# Patient Record
Sex: Male | Born: 1985 | Race: White | Hispanic: No | Marital: Married | Smoking: Current every day smoker
Health system: Southern US, Community
[De-identification: ages and names within clinical notes are randomized; demographics above are authoritative.]

---
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
## 2004-01-01 ENCOUNTER — Emergency Department (HOSPITAL_COMMUNITY): Admission: EM | Admit: 2004-01-01 | Discharge: 2004-01-02 | Payer: Self-pay | Admitting: Emergency Medicine

## 2004-11-04 ENCOUNTER — Emergency Department (HOSPITAL_COMMUNITY): Admission: EM | Admit: 2004-11-04 | Discharge: 2004-11-04 | Payer: Self-pay | Admitting: Emergency Medicine

## 2008-08-22 ENCOUNTER — Encounter: Payer: Self-pay | Admitting: Emergency Medicine

## 2008-08-22 ENCOUNTER — Ambulatory Visit: Payer: Self-pay | Admitting: Diagnostic Radiology

## 2008-08-23 ENCOUNTER — Inpatient Hospital Stay (HOSPITAL_COMMUNITY): Admission: RE | Admit: 2008-08-23 | Discharge: 2008-08-25 | Payer: Self-pay | Admitting: Internal Medicine

## 2009-06-01 ENCOUNTER — Emergency Department (HOSPITAL_COMMUNITY): Admission: EM | Admit: 2009-06-01 | Discharge: 2009-06-01 | Payer: Self-pay | Admitting: Family Medicine

## 2010-07-08 LAB — COMPREHENSIVE METABOLIC PANEL
ALT: 12 U/L (ref 0–53)
ALT: 9 U/L (ref 0–53)
AST: 11 U/L (ref 0–37)
AST: 17 U/L (ref 0–37)
Albumin: 3.1 g/dL — ABNORMAL LOW (ref 3.5–5.2)
Albumin: 4.6 g/dL (ref 3.5–5.2)
Alkaline Phosphatase: 41 U/L (ref 39–117)
Alkaline Phosphatase: 65 U/L (ref 39–117)
BUN: 7 mg/dL (ref 6–23)
BUN: 12 mg/dL (ref 6–23)
CO2: 28 mEq/L (ref 19–32)
CO2: 21 mEq/L (ref 19–32)
Calcium: 8.8 mg/dL (ref 8.4–10.5)
Calcium: 9.8 mg/dL (ref 8.4–10.5)
Chloride: 107 mEq/L (ref 96–112)
Chloride: 101 mEq/L (ref 96–112)
Creatinine, Ser: 0.95 mg/dL (ref 0.4–1.5)
Creatinine, Ser: 0.9 mg/dL (ref 0.4–1.5)
GFR calc Af Amer: >60 mL/min (ref >60)
GFR calc Af Amer: >60 mL/min (ref >60)
GFR calc non Af Amer: >60 mL/min (ref >60)
GFR calc non Af Amer: >60 mL/min (ref >60)
Glucose, Bld: 91 mg/dL (ref 70–99)
Glucose, Bld: 104 mg/dL — ABNORMAL HIGH (ref 70–99)
Potassium: 4.1 mEq/L (ref 3.5–5.1)
Potassium: 3.7 mEq/L (ref 3.5–5.1)
Sodium: 140 mEq/L (ref 135–145)
Sodium: 139 mEq/L (ref 135–145)
Total Bilirubin: 1.2 mg/dL (ref 0.3–1.2)
Total Bilirubin: 1.4 mg/dL — ABNORMAL HIGH (ref 0.3–1.2)
Total Protein: 6.1 g/dL (ref 6.0–8.3)
Total Protein: 8.3 g/dL (ref 6.0–8.3)

## 2010-07-08 LAB — CBC
HCT: 36.9 % — ABNORMAL LOW (ref 39.0–52.0)
HCT: 37 % — ABNORMAL LOW (ref 39.0–52.0)
HCT: 38 % — ABNORMAL LOW (ref 39.0–52.0)
HCT: 45.9 % (ref 39.0–52.0)
Hemoglobin: 12.8 g/dL — ABNORMAL LOW (ref 13.0–17.0)
Hemoglobin: 12.9 g/dL — ABNORMAL LOW (ref 13.0–17.0)
Hemoglobin: 13.2 g/dL (ref 13.0–17.0)
Hemoglobin: 15.9 g/dL (ref 13.0–17.0)
MCHC: 34.6 g/dL (ref 30.0–36.0)
MCHC: 34.8 g/dL (ref 30.0–36.0)
MCHC: 34.9 g/dL (ref 30.0–36.0)
MCHC: 34.6 g/dL (ref 30.0–36.0)
MCV: 87.3 fL (ref 78.0–100.0)
MCV: 87.9 fL (ref 78.0–100.0)
MCV: 88.2 fL (ref 78.0–100.0)
MCV: 86.3 fL (ref 78.0–100.0)
Platelets: 142 10*3/uL — ABNORMAL LOW (ref 150–400)
Platelets: 150 10*3/uL (ref 150–400)
Platelets: 179 10*3/uL (ref 150–400)
Platelets: 183 10*3/uL (ref 150–400)
RBC: 4.2 MIL/uL — ABNORMAL LOW (ref 4.22–5.81)
RBC: 4.2 MIL/uL — ABNORMAL LOW (ref 4.22–5.81)
RBC: 4.36 MIL/uL (ref 4.22–5.81)
RBC: 5.32 MIL/uL (ref 4.22–5.81)
RDW: 13.4 % (ref 11.5–15.5)
RDW: 13.7 % (ref 11.5–15.5)
RDW: 13.8 % (ref 11.5–15.5)
RDW: 12.7 % (ref 11.5–15.5)
WBC: 16.3 10*3/uL — ABNORMAL HIGH (ref 4.0–10.5)
WBC: 7.5 10*3/uL (ref 4.0–10.5)
WBC: 7.6 10*3/uL (ref 4.0–10.5)
WBC: 21.1 10*3/uL — ABNORMAL HIGH (ref 4.0–10.5)

## 2010-07-08 LAB — DIFFERENTIAL
Basophils Absolute: 0 10*3/uL (ref 0.0–0.1)
Basophils Absolute: 0.4 10*3/uL — ABNORMAL HIGH (ref 0.0–0.1)
Basophils Relative: 0 % (ref 0–1)
Basophils Relative: 2 % — ABNORMAL HIGH (ref 0–1)
Eosinophils Absolute: 0 10*3/uL (ref 0.0–0.7)
Eosinophils Absolute: 0 10*3/uL (ref 0.0–0.7)
Eosinophils Relative: 0 % (ref 0–5)
Eosinophils Relative: 0 % (ref 0–5)
Lymphocytes Relative: 12 % (ref 12–46)
Lymphocytes Relative: 9 % — ABNORMAL LOW (ref 12–46)
Lymphs Abs: 1.9 10*3/uL (ref 0.7–4.0)
Lymphs Abs: 1.9 10*3/uL (ref 0.7–4.0)
Monocytes Absolute: 2 10*3/uL — ABNORMAL HIGH (ref 0.1–1.0)
Monocytes Absolute: 2.1 10*3/uL — ABNORMAL HIGH (ref 0.1–1.0)
Monocytes Relative: 12 % (ref 3–12)
Monocytes Relative: 10 % (ref 3–12)
Neutro Abs: 12.3 10*3/uL — ABNORMAL HIGH (ref 1.7–7.7)
Neutro Abs: 16.7 10*3/uL — ABNORMAL HIGH (ref 1.7–7.7)
Neutrophils Relative %: 76 % (ref 43–77)
Neutrophils Relative %: 79 % — ABNORMAL HIGH (ref 43–77)

## 2010-07-08 LAB — STOOL CULTURE

## 2010-07-08 LAB — BASIC METABOLIC PANEL
BUN: 8 mg/dL (ref 6–23)
BUN: 8 mg/dL (ref 6–23)
CO2: 25 mEq/L (ref 19–32)
CO2: 28 mEq/L (ref 19–32)
Calcium: 8.5 mg/dL (ref 8.4–10.5)
Calcium: 9 mg/dL (ref 8.4–10.5)
Chloride: 103 mEq/L (ref 96–112)
Chloride: 107 mEq/L (ref 96–112)
Creatinine, Ser: 0.86 mg/dL (ref 0.4–1.5)
Creatinine, Ser: 0.89 mg/dL (ref 0.4–1.5)
GFR calc Af Amer: >60 mL/min (ref >60)
GFR calc Af Amer: >60 mL/min (ref >60)
GFR calc non Af Amer: >60 mL/min (ref >60)
GFR calc non Af Amer: >60 mL/min (ref >60)
Glucose, Bld: 83 mg/dL (ref 70–99)
Glucose, Bld: 99 mg/dL (ref 70–99)
Potassium: 3.7 mEq/L (ref 3.5–5.1)
Potassium: 3.7 mEq/L (ref 3.5–5.1)
Sodium: 137 mEq/L (ref 135–145)
Sodium: 141 mEq/L (ref 135–145)

## 2010-07-08 LAB — URINALYSIS, ROUTINE W REFLEX MICROSCOPIC
Bilirubin Urine: NEGATIVE
Glucose, UA: NEGATIVE mg/dL
Hgb urine dipstick: NEGATIVE
Ketones, ur: >80 mg/dL — AB
Nitrite: NEGATIVE
Protein, ur: NEGATIVE mg/dL
Specific Gravity, Urine: 1.008 (ref 1.005–1.030)
Urobilinogen, UA: 1 mg/dL (ref 0.0–1.0)
pH: 6.5 (ref 5.0–8.0)

## 2010-07-08 LAB — CLOSTRIDIUM DIFFICILE EIA

## 2010-08-12 NOTE — H&P (Signed)
NAME:  Bryan Gonzales, Bryan Gonzales NO.:  [id number]   MEDICAL RECORD NO.:  [id number]          PATIENT TYPE:  INP   LOCATION:  6743                         FACILITY:  [facility]   PHYSICIAN:  Michiel Cowboy, MDDATE OF BIRTH:  December 06, 1985   DATE OF ADMISSION:  08/23/2008  DATE OF DISCHARGE:                              HISTORY & PHYSICAL   PRIMARY CARE Armanie Martine:  Dr. Arletha Grippe with [facility].   CHIEF COMPLAINT:  Abdominal pain and then 2 days of diarrhea on Monday  and Tuesday.   The patient to 25 year old gentleman with no significant past medical  history who starting on Monday had two episodes of diarrhea and  abdominal pain, then he became constipated, tried to caster oil with no  results.  He has continued to have abdominal pain and gas at which point  he eventually presented to the walk-in clinic, who sent him over to be  evaluated in the emergency department for possible appendicitis.  CT  scan at the [facility] was obtained, showed  diverticulitis versus inflammatory bowel disease with a small amount of  free air, but no abscess.  The patient at this point was transferred to  [facility] under the care of [facility] hospitalist with a consult from  [facility], who felt that at this point the patient would be best  served by IV hydration, IV antibiotics, hold off on operation as the  patient does not appear to have an acute abdomen.   PAST MEDICAL HISTORY:  Unremarkable.   SOCIAL HISTORY:  The patient used to smoke, quit about one week ago.  Does not drink except occasionally.  Does not abuse drugs.   FAMILY HISTORY:  No significant inflammatory bowel disease in the  family, otherwise unremarkable.   ALLERGIES:  PENICILLIN.   MEDICATIONS:  None.   PHYSICAL EXAMINATION:  VITAL SIGNS:  Temperature 98.8, blood pressure  115/69, pulse 70, respirations 18.  Saturation 98% on room air.  GENERAL:  The patient appears to be in no acute distress  currently.  HEENT:  Head nontraumatic.  Moist mucous membranes.  LUNGS:  Clear to auscultation bilaterally.  HEART:  Regular rate and rhythm.  No murmurs, rubs or gallops.  ABDOMEN:  Soft.  There is mild generalized tenderness, but no rebound or  guarding noted.  LOWER EXTREMITIES:  Without clubbing, clubbing or edema.  NEUROLOGIC:  The patient appears to grossly intact.  SKIN:  Clean, dry and intact.   LABORATORY DATA:  White blood cell count 21.1, hemoglobin 15.9, sodium  139, potassium 3.7, creatinine 0.9.  UA negative.  CT scan revealed  rectosigmoid colon inflammation diverticulosis versus inflammatory bowel  disease.  There is a small amount of free air   ASSESSMENT/PLAN:  1. This is a 25 year old gentleman with the possibility of colitis      versus inflammatory bowel disease admitted for IV antibiotics      management.  2. Questionable diverticulitis.  Appreciate surgical consult.  We will      admit for Cipro and Flagyl IV and IV fluids.  Check for  orthostatics.  Keep n.p.o.  Give IV pain medications and follow.  3. Prophylaxis.  Protonix and sequential compression devices.      Michiel Cowboy, MD  Electronically Signed     AVD/MEDQ  D:  08/23/2008  T:  08/23/2008  Job:  [id number]   cc:   Nelma Rothman, MD

## 2010-08-12 NOTE — Consult Note (Signed)
NAME:  Bryan Gonzales, Bryan Gonzales NO.:  [id number]   MEDICAL RECORD NO.:  [id number]          PATIENT TYPE:  INP   LOCATION:  6743                         FACILITY:  [facility]   PHYSICIAN:  Michiel Cowboy, MDDATE OF BIRTH:  1985-07-02   DATE OF CONSULTATION:  08/23/2008  DATE OF DISCHARGE:                                 CONSULTATION   REASON FOR EVALUATION:  Diverticulitis.   HISTORY OF PRESENT ILLNESS:  This is a healthy 25 year old male with no  significant past medical history who presents with a 4-day history of  lower abdominal pain.  This pain has not localized to one side or the  other.  The pain is associated with a decreased appetite but no fever.  He reports nonbloody diarrhea over the last couple of days.  The patient  reports no previous similar episodes.  He has not had any problems with  his bowel movements in the past.  He denies any blood in the stool or  any mucus in his stool.  He denies any chronic abdominal pain or  abdominal cramping.  He was evaluated at [facility] earlier  where he was noted to be febrile and had a white count up to 21,000.  He  was then evaluated at the [facility] Department  where a CT scan showed mild thickening of the terminal ileum but a  normal-appearing appendix.  CT of the pelvis showed marked thickening of  the sigmoid colon down to the rectum with small amounts of free air seen  on couple of the images.  There is a small amount of fluid in the pelvis  but no sign of abscess.  The patient was transported to [facility] for  admission and we are asked to consult for management of his colon micro  perforation.   PAST MEDICAL HISTORY:  None.   PAST SURGICAL HISTORY:  Hernia and wisdom tooth extraction.   ALLERGIES:  PENICILLIN which causes itching, although it appears that  the patient did receive Unasyn in the emergency department.   MEDICATIONS:  None.   SOCIAL HISTORY:  The patient is a  smoker but is currently trying to  quit.  He does not drink alcohol.   LABORATORY DATA:  White count 21.1 and hemoglobin 15.9.  Electrolytes  within normal limits.  Total bilirubin is mildly elevated at 1.4, alk  phos 65, AST 17, and ALT 9.   PHYSICAL EXAMINATION:  VITAL SIGNS:  The patient is afebrile with  temperature 98.8, heart rate 70, blood pressure 113/69, and sats 98% on  room air.  GENERAL:  This is a well-developed, well-nourished male in no apparent  distress.  HEENT:  EOMI.  Sclerae anicteric.Marland Kitchen  NECK:  No mass and no thyromegaly.  LUNGS:  Clear.  Normal respiratory effort.  HEART:  Regular rate and rhythm.  No murmur.  ABDOMEN:  Hypoactive bowel sounds, nondistended, soft, tender in the  suprapubic region, and in both lower quadrants.  No palpable masses, no  rebound, some mild guarding.  EXTREMITIES:  No edema.  SKIN:  Warm and  dry with no sign of jaundice.   IMPRESSION:  Although this is unusual in this age group, this appears to  be sigmoid diverticulitis with micro perforation but no abscess.  Also  the differential diagnosis is little bit less likely given his history  of inflammatory bowel disease such as Crohn's versus ulcerative colitis.   RECOMMENDATIONS:  We will keep the patient n.p.o. and bowel rest.  IV  hydration and IV antibiotics.  Currently, he is ordered to start  ciprofloxacin and Flagyl and these antibiotics are suitable.  We will  continue to follow.  If his condition worsens, then I explained to the  patient that he might need an urgent operation which might result in a  temporary colostomy.  However, he does not appear to be septic and I  think we can try to manage this nonoperatively at this admission.  It is  likely that in the future he will need further workup with colonoscopy  and possible elective colon resection.  We will continue to follow with  you.      Wilmon Arms. Tsuei, M.D.  Electronically Signed      Michiel Cowboy,  MD  Electronically Signed    MKT/MEDQ  D:  08/23/2008  T:  08/23/2008  Job:  [id number]

## 2012-08-15 ENCOUNTER — Emergency Department: Payer: Self-pay | Admitting: Emergency Medicine

## 2012-08-15 LAB — COMPREHENSIVE METABOLIC PANEL
Albumin: 4.1 g/dL (ref 3.4–5.0)
Alkaline Phosphatase: 55 U/L (ref 50–136)
Anion Gap: 9 (ref 7–16)
BUN: 12 mg/dL (ref 7–18)
Bilirubin,Total: 0.6 mg/dL (ref 0.2–1.0)
Calcium, Total: 9.4 mg/dL (ref 8.5–10.1)
Chloride: 105 mmol/L (ref 98–107)
Co2: 23 mmol/L (ref 21–32)
Creatinine: 0.84 mg/dL (ref 0.60–1.30)
EGFR (African American): >60
EGFR (Non-African Amer.): >60
Glucose: 93 mg/dL (ref 65–99)
Osmolality: 273 (ref 275–301)
Potassium: 3.9 mmol/L (ref 3.5–5.1)
SGOT(AST): 26 U/L (ref 15–37)
SGPT (ALT): 25 U/L (ref 12–78)
Sodium: 137 mmol/L (ref 136–145)
Total Protein: 7.3 g/dL (ref 6.4–8.2)

## 2012-08-15 LAB — CBC
HCT: 40.1 % (ref 40.0–52.0)
HGB: 13.9 g/dL (ref 13.0–18.0)
MCH: 29.3 pg (ref 26.0–34.0)
MCHC: 34.6 g/dL (ref 32.0–36.0)
MCV: 85 fL (ref 80–100)
Platelet: 208 10*3/uL (ref 150–440)
RBC: 4.74 10*6/uL (ref 4.40–5.90)
RDW: 13.7 % (ref 11.5–14.5)
WBC: 14.6 10*3/uL — ABNORMAL HIGH (ref 3.8–10.6)

## 2012-08-16 LAB — URINALYSIS, COMPLETE
Bacteria: NONE SEEN
Bilirubin,UR: NEGATIVE
Blood: NEGATIVE
Glucose,UR: NEGATIVE mg/dL (ref 0–75)
Leukocyte Esterase: NEGATIVE
Nitrite: NEGATIVE
Ph: 6 (ref 4.5–8.0)
Protein: NEGATIVE
RBC,UR: 1 /HPF (ref 0–5)
Specific Gravity: 1.026 (ref 1.003–1.030)
Squamous Epithelial: 1
WBC UR: 1 /HPF (ref 0–5)

## 2012-08-21 LAB — CULTURE, BLOOD (SINGLE)

## 2013-08-15 ENCOUNTER — Emergency Department (HOSPITAL_BASED_OUTPATIENT_CLINIC_OR_DEPARTMENT_OTHER)
Admission: EM | Admit: 2013-08-15 | Discharge: 2013-08-15 | Disposition: A | Discharge status: Home / Self Care | Payer: Self-pay | Attending: Emergency Medicine | Admitting: Emergency Medicine

## 2013-08-15 ENCOUNTER — Encounter (HOSPITAL_BASED_OUTPATIENT_CLINIC_OR_DEPARTMENT_OTHER): Payer: Self-pay | Admitting: Emergency Medicine

## 2013-08-15 DIAGNOSIS — J3489 Other specified disorders of nose and nasal sinuses: Secondary | ICD-10-CM | POA: Insufficient documentation

## 2013-08-15 DIAGNOSIS — R52 Pain, unspecified: Secondary | ICD-10-CM | POA: Insufficient documentation

## 2013-08-15 DIAGNOSIS — F17200 Nicotine dependence, unspecified, uncomplicated: Secondary | ICD-10-CM | POA: Insufficient documentation

## 2013-08-15 DIAGNOSIS — J029 Acute pharyngitis, unspecified: Secondary | ICD-10-CM | POA: Insufficient documentation

## 2013-08-15 DIAGNOSIS — Z880 Allergy status to penicillin: Secondary | ICD-10-CM | POA: Insufficient documentation

## 2013-08-15 LAB — RAPID STREP SCREEN (MED CTR MEBANE ONLY): Streptococcus, Group A Screen (Direct): NEGATIVE

## 2013-08-15 MED ORDER — IBUPROFEN 800 MG PO TABS: 800.0000 mg | ORAL_TABLET | Freq: Three times a day (TID) | ORAL | Status: AC

## 2013-08-15 MED ORDER — HYDROCODONE-ACETAMINOPHEN 5-325 MG PO TABS: 2.0000 | ORAL_TABLET | ORAL | Status: AC | PRN

## 2013-08-15 MED ORDER — IBUPROFEN 800 MG PO TABS
800.0000 mg | ORAL_TABLET | Freq: Once | ORAL | Status: AC
  Administered 2013-08-15: 800 mg via ORAL
  Filled 2013-08-15: qty 1

## 2013-08-15 MED ORDER — HYDROCODONE-ACETAMINOPHEN 5-325 MG PO TABS
2.0000 | ORAL_TABLET | Freq: Once | ORAL | Status: AC
  Administered 2013-08-15: 2 via ORAL
  Filled 2013-08-15: qty 2

## 2013-08-15 NOTE — ED Notes (Signed)
Pt reports around noon today started having body aches, sore throat, and nasal congestion with headache. s

## 2013-08-15 NOTE — Discharge Instructions (Signed)
Viral Infections °A virus is a type of germ. Viruses can cause: °· Minor sore throats. °· Aches and pains. °· Headaches. °· Runny nose. °· Rashes. °· Watery eyes. °· Tiredness. °· Coughs. °· Loss of appetite. °· Feeling sick to your stomach (nausea). °· Throwing up (vomiting). °· Watery poop (diarrhea). °HOME CARE  °· Only take medicines as told by your doctor. °· Drink enough water and fluids to keep your pee (urine) clear or pale yellow. Sports drinks are a good choice. °· Get plenty of rest and eat healthy. Soups and broths with crackers or rice are fine. °GET HELP RIGHT AWAY IF:  °· You have a very bad headache. °· You have shortness of breath. °· You have chest pain or neck pain. °· You have an unusual rash. °· You cannot stop throwing up. °· You have watery poop that does not stop. °· You cannot keep fluids down. °· You or your child has a temperature by mouth above 102° F (38.9° C), not controlled by medicine. °· Your baby is older than 3 months with a rectal temperature of 102° F (38.9° C) or higher. °· Your baby is 3 months old or younger with a rectal temperature of 100.4° F (38° C) or higher. °MAKE SURE YOU:  °· Understand these instructions. °· Will watch this condition. °· Will get help right away if you are not doing well or get worse. °Document Released: 02/27/2008 Document Revised: 06/08/2011 Document Reviewed: 07/22/2010 °ExitCare® Patient Information ©2014 [employer]. ° °

## 2013-08-15 NOTE — ED Provider Notes (Signed)
CSN: [id number]     Arrival date & time 08/15/13  1821 History   First MD Initiated Contact with Patient 08/15/13 1948     Chief Complaint  Patient presents with  . Generalized Body Aches  . Sore Throat  . Nasal Congestion     (Consider location/radiation/quality/duration/timing/severity/associated sxs/prior Treatment) Patient is a 28 y.o. male presenting with pharyngitis. The history is provided by the patient. No language interpreter was used.  Sore Throat This is a new problem. The current episode started today. The problem occurs constantly. The problem has been gradually worsening. Associated symptoms include a sore throat. Pertinent negatives include no myalgias. Nothing aggravates the symptoms. He has tried nothing for the symptoms. The treatment provided no relief.    History reviewed. No pertinent past medical history. History reviewed. No pertinent past surgical history. No family history on file. History  Substance Use Topics  . Smoking status: Current Every Day Smoker -- 0.50 packs/day    Types: Cigarettes  . Smokeless tobacco: Not on file  . Alcohol Use: Yes     Comment: socially    Review of Systems  HENT: Positive for sore throat.   Musculoskeletal: Negative for myalgias.  All other systems reviewed and are negative.     Allergies  Penicillins  Home Medications   Prior to Admission medications   Not on File   BP 113/68  Pulse 88  Temp(Src) 98.8 F (37.1 C) (Oral)  Resp 18  Ht 5\' 9"  (1.753 m)  Wt 155 lb (70.308 kg)  BMI 22.88 kg/m2  SpO2 100% Physical Exam  Nursing note and vitals reviewed. Constitutional: He is oriented to person, place, and time. He appears well-developed and well-nourished.  HENT:  Head: Normocephalic.  Right Ear: External ear normal.  Left Ear: External ear normal.  Nose: Nose normal.  Mouth/Throat: Oropharynx is clear and moist.  Eyes: Conjunctivae are normal. Pupils are equal, round, and reactive to light.  Neck:  Normal range of motion. Neck supple.  Cardiovascular: Normal rate and normal heart sounds.   Pulmonary/Chest: Effort normal and breath sounds normal.  Musculoskeletal: Normal range of motion.  Neurological: He is alert and oriented to person, place, and time. He has normal reflexes.  Skin: Skin is warm.  Psychiatric: He has a normal mood and affect.    ED Course  Procedures (including critical care time) Labs Review Labs Reviewed  RAPID STREP SCREEN  CULTURE, GROUP A STREP    Imaging Review No results found.   EKG Interpretation None      MDM   Final diagnoses:  Pharyngitis  strep negative,    Hydrocodone, ibuprofen    Elson AreasLeslie K Chelby Salata, PA-C 08/15/13 2148  Lonia SkinnerLeslie K ViennaSofia, [place] JerseyPA-C 08/15/13 2157

## 2013-08-16 NOTE — ED Provider Notes (Signed)
Medical screening examination/treatment/procedure(s) were performed by non-physician practitioner and as supervising physician I was immediately available for consultation/collaboration.  Megan E Docherty, MD 08/16/13 2226 

## 2013-08-17 LAB — CULTURE, GROUP A STREP

## 2016-01-25 ENCOUNTER — Encounter (HOSPITAL_BASED_OUTPATIENT_CLINIC_OR_DEPARTMENT_OTHER): Payer: Self-pay | Admitting: *Deleted

## 2016-01-25 ENCOUNTER — Emergency Department (HOSPITAL_BASED_OUTPATIENT_CLINIC_OR_DEPARTMENT_OTHER): Payer: Self-pay

## 2016-01-25 ENCOUNTER — Emergency Department (HOSPITAL_BASED_OUTPATIENT_CLINIC_OR_DEPARTMENT_OTHER)
Admission: EM | Admit: 2016-01-25 | Discharge: 2016-01-25 | Disposition: A | Discharge status: Home / Self Care | Payer: Self-pay | Attending: Emergency Medicine | Admitting: Emergency Medicine

## 2016-01-25 DIAGNOSIS — K858 Other acute pancreatitis without necrosis or infection: Secondary | ICD-10-CM | POA: Insufficient documentation

## 2016-01-25 DIAGNOSIS — F17210 Nicotine dependence, cigarettes, uncomplicated: Secondary | ICD-10-CM | POA: Insufficient documentation

## 2016-01-25 DIAGNOSIS — R0789 Other chest pain: Secondary | ICD-10-CM | POA: Insufficient documentation

## 2016-01-25 LAB — CBC WITH DIFFERENTIAL/PLATELET
Basophils Absolute: 0 10*3/uL (ref 0.0–0.1)
Basophils Relative: 0 %
Eosinophils Absolute: 0 10*3/uL (ref 0.0–0.7)
Eosinophils Relative: 0 %
HCT: 48 % (ref 39.0–52.0)
HEMOGLOBIN: 17.1 g/dL — AB (ref 13.0–17.0)
LYMPHS ABS: 0.9 10*3/uL (ref 0.7–4.0)
Lymphocytes Relative: 4 %
MCH: 29.5 pg (ref 26.0–34.0)
MCHC: 35.6 g/dL (ref 30.0–36.0)
MCV: 82.9 fL (ref 78.0–100.0)
Monocytes Absolute: 0.8 10*3/uL (ref 0.1–1.0)
Monocytes Relative: 4 %
NEUTROS ABS: 20.3 10*3/uL — AB (ref 1.7–7.7)
NEUTROS PCT: 92 %
Platelets: 218 10*3/uL (ref 150–400)
RBC: 5.79 MIL/uL (ref 4.22–5.81)
RDW: 13.8 % (ref 11.5–15.5)
WBC: 22.1 10*3/uL — ABNORMAL HIGH (ref 4.0–10.5)

## 2016-01-25 LAB — LIPASE, BLOOD: LIPASE: 302 U/L — AB (ref 11–51)

## 2016-01-25 LAB — COMPREHENSIVE METABOLIC PANEL
ALK PHOS: 42 U/L (ref 38–126)
ALT: 19 U/L (ref 17–63)
AST: 20 U/L (ref 15–41)
Albumin: 4.6 g/dL (ref 3.5–5.0)
Anion gap: 11 (ref 5–15)
BILIRUBIN TOTAL: 1.1 mg/dL (ref 0.3–1.2)
BUN: 17 mg/dL (ref 6–20)
CALCIUM: 9.6 mg/dL (ref 8.9–10.3)
CO2: 20 mmol/L — ABNORMAL LOW (ref 22–32)
CREATININE: 0.89 mg/dL (ref 0.61–1.24)
Chloride: 107 mmol/L (ref 101–111)
Glucose, Bld: 156 mg/dL — ABNORMAL HIGH (ref 65–99)
Potassium: 4 mmol/L (ref 3.5–5.1)
Sodium: 138 mmol/L (ref 135–145)
Total Protein: 7.6 g/dL (ref 6.5–8.1)

## 2016-01-25 LAB — TROPONIN I

## 2016-01-25 MED ORDER — OXYCODONE-ACETAMINOPHEN 5-325 MG PO TABS: 1.0000 | ORAL_TABLET | ORAL | 0 refills | Status: DC | PRN

## 2016-01-25 MED ORDER — ONDANSETRON 4 MG PO TBDP: 4.0000 mg | ORAL_TABLET | Freq: Three times a day (TID) | ORAL | 0 refills | Status: AC | PRN

## 2016-01-25 MED ORDER — KETOROLAC TROMETHAMINE 30 MG/ML IJ SOLN
30.0000 mg | Freq: Once | INTRAMUSCULAR | Status: AC
  Administered 2016-01-25: 30 mg via INTRAVENOUS
  Filled 2016-01-25: qty 1

## 2016-01-25 MED ORDER — SODIUM CHLORIDE 0.9 % IV BOLUS (SEPSIS)
1000.0000 mL | Freq: Once | INTRAVENOUS | Status: AC
  Administered 2016-01-25: 1000 mL via INTRAVENOUS

## 2016-01-25 MED ORDER — HYDROMORPHONE HCL 1 MG/ML IJ SOLN
1.0000 mg | Freq: Once | INTRAMUSCULAR | Status: AC
  Administered 2016-01-25: 1 mg via INTRAVENOUS
  Filled 2016-01-25: qty 1

## 2016-01-25 MED ORDER — ONDANSETRON HCL 4 MG/2ML IJ SOLN
4.0000 mg | Freq: Once | INTRAMUSCULAR | Status: AC
  Administered 2016-01-25: 4 mg via INTRAVENOUS
  Filled 2016-01-25: qty 2

## 2016-01-25 MED ORDER — IOPAMIDOL (ISOVUE-300) INJECTION 61%
100.0000 mL | Freq: Once | INTRAVENOUS | Status: AC | PRN
  Administered 2016-01-25: 100 mL via INTRAVENOUS

## 2016-01-25 NOTE — ED Provider Notes (Signed)
[facility]   CSN: [id number] Arrival date & time: 01/25/16  1016     History   Chief Complaint Chief Complaint  Patient presents with  . Abdominal Pain    HPI Bryan Gonzales is a 30 y.o. male.  The history is provided by the patient and medical records.  Abdominal Pain   Associated symptoms include diarrhea, nausea and vomiting.     30 year old male with no significant past medical history presenting to the ED for abdominal pain and chest pain.  Patient states he was at a concert one week ago and did some stage diving and crowd surfing. He states he had some pain in his left chest wall intermittently afterwards. States he has not had any shortness of breath, dizziness, or weakness. He has no known cardiac history. He is a smoker. States this morning he woke up around 4 AM with abdominal cramping, nausea, vomiting, and diarrhea. States he throughout and had diarrhea for about an hour and a half. States afterwards he had worsening pain in his left chest.  States he does not feel like it is his heart, but states he cannot get comfortable. He does report recent sick contacts with GI bug. He denies any fever or chills. He has not tried any medications thus far this morning.  History reviewed. No pertinent past medical history.  There are no active problems to display for this patient.   History reviewed. No pertinent surgical history.     Home Medications    Prior to Admission medications   Medication Sig Start Date End Date Taking? Authorizing Provider  HYDROcodone-acetaminophen (NORCO/VICODIN) 5-325 MG per tablet Take 2 tablets by mouth every 4 (four) hours as needed. 08/15/13   Elson AreasLeslie K Sofia, PA-C  ibuprofen (ADVIL,MOTRIN) 800 MG tablet Take 1 tablet (800 mg total) by mouth 3 (three) times daily. 08/15/13   Elson AreasLeslie K Sofia, PA-C    Family History No family history on file.  Social History Social History  Substance Use Topics  . Smoking  status: Current Every Day Smoker    Packs/day: 0.50    Types: Cigarettes  . Smokeless tobacco: Not on file  . Alcohol use Yes     Comment: socially     Allergies   Penicillins   Review of Systems Review of Systems  Cardiovascular: Positive for chest pain.  Gastrointestinal: Positive for abdominal pain, diarrhea, nausea and vomiting.  All other systems reviewed and are negative.    Physical Exam Updated Vital Signs BP (!) 147/122   Pulse 69   Temp 98.8 F (37.1 C) (Oral)   Resp 18   Ht 5\' 9"  (1.753 m)   Wt 72.6 kg   SpO2 100%   BMI 23.63 kg/m   Physical Exam  Constitutional: He is oriented to person, place, and time. He appears well-developed and well-nourished.  Pacing around the room, will not sit down  HENT:  Head: Normocephalic and atraumatic.  Mouth/Throat: Oropharynx is clear and moist.  Mucous membranes appear mildly dry  Eyes: Conjunctivae and EOM are normal. Pupils are equal, round, and reactive to light.  Neck: Normal range of motion.  Cardiovascular: Normal rate, regular rhythm and normal heart sounds.   Pulmonary/Chest: Effort normal and breath sounds normal.  Left chest wall is tender to palpation surrounding the areola and extending into the left axilla, there is no bruising or bony deformity, lungs clear, no distress  Abdominal: Soft. Bowel sounds are normal. There is tenderness in the  epigastric area. There is no rigidity and no guarding.  Abdomen soft, mild TTP in epigastrium  Musculoskeletal: Normal range of motion.  Neurological: He is alert and oriented to person, place, and time.  Skin: Skin is warm and dry.  Psychiatric: He has a normal mood and affect.  Nursing note and vitals reviewed.    ED Treatments / Results  Labs (all labs ordered are listed, but only abnormal results are displayed) Labs Reviewed  CBC WITH DIFFERENTIAL/PLATELET - Abnormal; Notable for the following:       Result Value   WBC 22.1 (*)    Hemoglobin 17.1 (*)     Neutro Abs 20.3 (*)    All other components within normal limits  COMPREHENSIVE METABOLIC PANEL - Abnormal; Notable for the following:    CO2 20 (*)    Glucose, Bld 156 (*)    All other components within normal limits  LIPASE, BLOOD - Abnormal; Notable for the following:    Lipase 302 (*)    All other components within normal limits  TROPONIN I    EKG  EKG Interpretation  Date/Time:  Saturday January 25 2016 10:28:49 EDT Ventricular Rate:  75 PR Interval:    QRS Duration: 96 QT Interval:  377 QTC Calculation: 421 R Axis:   -112 Text Interpretation:  Sinus rhythm Left anterior fascicular block Low voltage, extremity leads Baseline wander in lead(s) I II III aVR aVL aVF V2 V3 V4 V5 V6 No old tracing to compare Confirmed by FLOYD MD, DANIEL [phone]([id number]) on 01/25/2016 10:32:34 AM       Radiology Dg Chest 2 View  Result Date: 01/25/2016 CLINICAL DATA:  Left chest wall pain, vomiting EXAM: CHEST  2 VIEW COMPARISON:  None. FINDINGS: Lungs are clear.  No pleural effusion or pneumothorax. The heart is normal in size. Visualized osseous structures are within normal limits. IMPRESSION: Normal chest radiographs. Electronically Signed   By: Charline BillsSriyesh  Krishnan M.D.   On: 01/25/2016 12:03   Ct Abdomen Pelvis W Contrast  Result Date: 01/25/2016 CLINICAL DATA:  Left upper quadrant pain with nausea vomiting. EXAM: CT ABDOMEN AND PELVIS WITH CONTRAST TECHNIQUE: Multidetector CT imaging of the abdomen and pelvis was performed using the standard protocol following bolus administration of intravenous contrast. CONTRAST:  100mL ISOVUE-300 IOPAMIDOL (ISOVUE-300) INJECTION 61% COMPARISON:  08/22/2008 FINDINGS: Lower chest: No acute abnormality. Hepatobiliary: No focal liver abnormality is seen. No gallstones, gallbladder wall thickening, or biliary dilatation. Pancreas: Mild hypoenhancement of the mid pancreatic body with expansion and peripancreatic inflammatory changes most consistent with acute pancreatitis.  Peripancreatic fluid is present. Spleen: Normal in size without focal abnormality. Adrenals/Urinary Tract: Adrenal glands are unremarkable. Kidneys are normal, without renal calculi, focal lesion, or hydronephrosis. Bladder is unremarkable. Stomach/Bowel: Stomach is within normal limits. Appendix appears normal. No evidence of bowel wall thickening, distention, or inflammatory changes. Diverticulosis without evidence of diverticulitis. Vascular/Lymphatic: No significant vascular findings are present. No enlarged abdominal or pelvic lymph nodes. Reproductive: Prostate is unremarkable. Other: No abdominal wall hernia or abnormality. Musculoskeletal: No acute or significant osseous findings. IMPRESSION: 1. Findings consistent with acute pancreatitis. No focal fluid collection to suggest a pseudocyst or abscess. Electronically Signed   By: Elige KoHetal  Patel   On: 01/25/2016 14:01    Procedures Procedures (including critical care time)  Medications Ordered in ED Medications  sodium chloride 0.9 % bolus 1,000 mL (0 mLs Intravenous Stopped 01/25/16 1245)  ondansetron (ZOFRAN) injection 4 mg (4 mg Intravenous Given 01/25/16 1123)  ketorolac (TORADOL) 30  MG/ML injection 30 mg (30 mg Intravenous Given 01/25/16 1124)  HYDROmorphone (DILAUDID) injection 1 mg (1 mg Intravenous Given 01/25/16 1233)  iopamidol (ISOVUE-300) 61 % injection 100 mL (100 mLs Intravenous Contrast Given 01/25/16 1335)     Initial Impression / Assessment and Plan / ED Course  I have reviewed the triage vital signs and the nursing notes.  Pertinent labs & imaging results that were available during my care of the patient were reviewed by me and considered in my medical decision making (see chart for details).  Clinical Course   30 year old male here with abdominal and left-sided chest pain. Chest pain worsened this morning after heavy bouts of vomiting. He is afebrile and nontoxic here. Left chest wall is tender to palpation without noted  deformity or bruising. Some tenderness in the epigastrium of the abdomen. No rebound or guarding. Labs obtained, leukocytosis noted a 20 2K. Lipase elevated at 302 consistent with pancreatitis. Patient denies any alcohol use in the past 24 hours. States he is an occasional drinker. No history of gallstones. Given his significant leukocytosis, CT scan was obtained, consistent with pancreatic inflammation, no signs of abscess or pseudocyst. Patient has remained stable here without any episodes of emesis or diarrhea. His pain is controlled. He is tolerating fluids well.  His chest pain seems musculoskeletal in nature as it is reproducible on exam.  EKG is nonischemic. Troponin is negative. Has no known cardiac risk factors aside from smoking. Lower suspicion for ACS, PE, dissection, or other acute cardiac event.  At this time, feel patient stable for discharge with symptomatic care. Discussed dietary modifications including limiting alcohol use. He was given GI follow-up if symptoms persist.  Discussed plan with patient, he acknowledged understanding and agreed with plan of care.  Return precautions given for new or worsening symptoms.  Final Clinical Impressions(s) / ED Diagnoses   Final diagnoses:  Other acute pancreatitis, unspecified complication status    New Prescriptions New Prescriptions   ONDANSETRON (ZOFRAN ODT) 4 MG DISINTEGRATING TABLET    Take 1 tablet (4 mg total) by mouth every 8 (eight) hours as needed for nausea.   OXYCODONE-ACETAMINOPHEN (PERCOCET/ROXICET) 5-325 MG TABLET    Take 1 tablet by mouth every 4 (four) hours as needed.     Garlon Hatchet, PA-C 01/25/16 1509    Melene Plan, DO 01/25/16 [phone]

## 2016-01-25 NOTE — ED Triage Notes (Addendum)
Patient states he woke at 0400 with abdominal pain which was associated with nausea, vomiting x 6 and diarrhea x 3.  States he injured his left chest one week ago at a concert where he was "stage diving" and dancing rough.  States after the vomiting episode this morning, the left chest began to hurt constantly. Patient is pacing in the room and cussing about his pain.

## 2016-01-25 NOTE — Discharge Instructions (Signed)
Take the prescribed medication as directed.  Do not drive while taking pain medicine. Recommend clear diet for the next 24 hours to allow bowel to rest.  May start back with bland diet and progress back to normal as tolerated. Avoid alcohol as this is a known trigger of pancreatitis. Follow-up with GI if symptoms not improving or become a recurrent issue. Return to the ED for new or worsening symptoms.

## 2016-02-01 ENCOUNTER — Emergency Department (HOSPITAL_BASED_OUTPATIENT_CLINIC_OR_DEPARTMENT_OTHER)
Admission: EM | Admit: 2016-02-01 | Discharge: 2016-02-01 | Disposition: A | Discharge status: Home / Self Care | Payer: Self-pay | Attending: Emergency Medicine | Admitting: Emergency Medicine

## 2016-02-01 ENCOUNTER — Emergency Department (HOSPITAL_BASED_OUTPATIENT_CLINIC_OR_DEPARTMENT_OTHER): Payer: Self-pay

## 2016-02-01 ENCOUNTER — Encounter (HOSPITAL_BASED_OUTPATIENT_CLINIC_OR_DEPARTMENT_OTHER): Payer: Self-pay | Admitting: Emergency Medicine

## 2016-02-01 DIAGNOSIS — F17210 Nicotine dependence, cigarettes, uncomplicated: Secondary | ICD-10-CM | POA: Insufficient documentation

## 2016-02-01 DIAGNOSIS — R0789 Other chest pain: Secondary | ICD-10-CM | POA: Insufficient documentation

## 2016-02-01 LAB — COMPREHENSIVE METABOLIC PANEL
ALBUMIN: 4.2 g/dL (ref 3.5–5.0)
ALT: 20 U/L (ref 17–63)
ANION GAP: 8 (ref 5–15)
AST: 19 U/L (ref 15–41)
Alkaline Phosphatase: 47 U/L (ref 38–126)
BUN: 9 mg/dL (ref 6–20)
CALCIUM: 9.6 mg/dL (ref 8.9–10.3)
CO2: 29 mmol/L (ref 22–32)
Chloride: 101 mmol/L (ref 101–111)
Creatinine, Ser: 0.82 mg/dL (ref 0.61–1.24)
GFR calc non Af Amer: >60 mL/min (ref >60)
GLUCOSE: 85 mg/dL (ref 65–99)
POTASSIUM: 4.1 mmol/L (ref 3.5–5.1)
SODIUM: 138 mmol/L (ref 135–145)
Total Bilirubin: 0.2 mg/dL — ABNORMAL LOW (ref 0.3–1.2)
Total Protein: 7.8 g/dL (ref 6.5–8.1)

## 2016-02-01 LAB — CBC WITH DIFFERENTIAL/PLATELET
BASOS ABS: 0 10*3/uL (ref 0.0–0.1)
Basophils Relative: 0 %
EOS ABS: 0.1 10*3/uL (ref 0.0–0.7)
Eosinophils Relative: 1 %
HCT: 44.1 % (ref 39.0–52.0)
Hemoglobin: 15.1 g/dL (ref 13.0–17.0)
LYMPHS ABS: 3.4 10*3/uL (ref 0.7–4.0)
Lymphocytes Relative: 35 %
MCH: 29.7 pg (ref 26.0–34.0)
MCHC: 34.2 g/dL (ref 30.0–36.0)
MCV: 86.8 fL (ref 78.0–100.0)
MONO ABS: 0.3 10*3/uL (ref 0.1–1.0)
MONOS PCT: 3 %
NEUTROS ABS: 5.9 10*3/uL (ref 1.7–7.7)
Neutrophils Relative %: 61 %
PLATELETS: 289 10*3/uL (ref 150–400)
RBC: 5.08 MIL/uL (ref 4.22–5.81)
RDW: 13.4 % (ref 11.5–15.5)
WBC: 9.7 10*3/uL (ref 4.0–10.5)

## 2016-02-01 LAB — LIPASE, BLOOD: Lipase: 53 U/L — ABNORMAL HIGH (ref 11–51)

## 2016-02-01 LAB — TROPONIN I

## 2016-02-01 MED ORDER — OXYCODONE-ACETAMINOPHEN 5-325 MG PO TABS
1.0000 | ORAL_TABLET | Freq: Once | ORAL | Status: AC
  Administered 2016-02-01: 1 via ORAL
  Filled 2016-02-01: qty 1

## 2016-02-01 MED ORDER — KETOROLAC TROMETHAMINE 30 MG/ML IJ SOLN
15.0000 mg | Freq: Once | INTRAMUSCULAR | Status: AC
  Administered 2016-02-01: 15 mg via INTRAVENOUS
  Filled 2016-02-01: qty 1

## 2016-02-01 MED ORDER — NAPROXEN 500 MG PO TABS: 500.0000 mg | ORAL_TABLET | Freq: Two times a day (BID) | ORAL | 0 refills | Status: AC

## 2016-02-01 MED ORDER — OXYCODONE-ACETAMINOPHEN 5-325 MG PO TABS: 1.0000 | ORAL_TABLET | Freq: Four times a day (QID) | ORAL | 0 refills | Status: AC | PRN

## 2016-02-01 NOTE — ED Triage Notes (Addendum)
Pt in c/o L lateral chest/rib cage pain onset over 1 week ago, worse with movement and deep inspiration. Was seen here on the 27th with negative workup. Pt alert, interactive, ambulatory in NAD.

## 2016-02-01 NOTE — ED Notes (Signed)
Patient transported to X-ray 

## 2016-02-01 NOTE — Discharge Instructions (Signed)
Take your medications as prescribed as needed for pain relief. I recommend following up with her primary care provider within the next week if your pain has not improved. Please return to the Emergency Department if symptoms worsen or new onset of fever, headache, lightheadedness/dizziness, nasal/worsening chest pain, difficulty breathing, productive cough, coughing up blood, abdominal pain, vomiting, leg swelling.

## 2016-02-01 NOTE — ED Notes (Signed)
States has had chest pain, worse with movement x 2 weeks , after body surfing at a concert.

## 2016-02-01 NOTE — ED Provider Notes (Signed)
[facility] Provider Note   CSN: [id number] Arrival date & time: 02/01/16  1414  By signing my name below, I, Modena Jansky, attest that this documentation has been prepared under the direction and in the presence of non-physician practitioner, Melburn Hake, PA-C. Electronically Signed: Modena Jansky, Scribe. 02/01/2016. 4:55 PM.  History   Chief Complaint Chief Complaint  Patient presents with  . Chest Pain   The history is provided by the patient and medical records. No language interpreter was used.   HPI Comments: Bryan Gonzales is a 30 y.o. male who presents to the Emergency Department complaining of constant moderate left-sided chest pain that started about a 2 weeks ago. Per medical record, he was seen in the ED for pancreatitis on 01/25/16, which pt states has now been resolved. He describes the pain as a sharp sensation that radiates to his back. He reports the pain is exacerbated by coughing, breathing deeply, and certain movements. He states he took Percocet and Ibuprofen with temporary relief. He admits to having a falling episode 2 weeks ago while he was crowd surfing at a concert. He denies any prior hx of similar complaint, recent long trips, recent surgeries, hx of cancer, hx of DVT, vomiting, cough, wheezing, SOB, heart palpations, nausea, vomiting, urinary symptoms, diarrhea, rash, leg swelling, numbness, or other complaints.   History reviewed. No pertinent past medical history.  There are no active problems to display for this patient.   History reviewed. No pertinent surgical history.     Home Medications    Prior to Admission medications   Medication Sig Start Date End Date Taking? Authorizing Provider  HYDROcodone-acetaminophen (NORCO/VICODIN) 5-325 MG per tablet Take 2 tablets by mouth every 4 (four) hours as needed. 08/15/13   Elson Areas, PA-C  ibuprofen (ADVIL,MOTRIN) 800 MG tablet Take 1 tablet (800 mg total) by mouth 3 (three) times  daily. 08/15/13   Elson Areas, PA-C  naproxen (NAPROSYN) 500 MG tablet Take 1 tablet (500 mg total) by mouth 2 (two) times daily. 02/01/16   Barrett Henle, PA-C  ondansetron (ZOFRAN ODT) 4 MG disintegrating tablet Take 1 tablet (4 mg total) by mouth every 8 (eight) hours as needed for nausea. 01/25/16   Garlon Hatchet, PA-C  oxyCODONE-acetaminophen (PERCOCET/ROXICET) 5-325 MG tablet Take 1 tablet by mouth every 6 (six) hours as needed. 02/01/16   Barrett Henle, PA-C    Family History History reviewed. No pertinent family history.  Social History Social History  Substance Use Topics  . Smoking status: Current Every Day Smoker    Packs/day: 0.50    Types: Cigarettes  . Smokeless tobacco: Not on file  . Alcohol use Yes     Comment: socially     Allergies   Penicillins   Review of Systems Review of Systems  Respiratory: Negative for cough, shortness of breath and wheezing.   Cardiovascular: Positive for chest pain. Negative for palpitations and leg swelling.  Gastrointestinal: Negative for diarrhea, nausea and vomiting.  Genitourinary: Negative for discharge and dysuria.  All other systems reviewed and are negative.    Physical Exam Updated Vital Signs BP 107/67 (BP Location: Right Arm)   Pulse (!) 54   Temp 98.7 F (37.1 C)   Resp 20   Ht 5\' 9"  (1.753 m)   Wt 160 lb (72.6 kg)   SpO2 100%   BMI 23.63 kg/m   Physical Exam  Constitutional: He is oriented to person, place, and time. He appears well-developed  and well-nourished.  HENT:  Head: Normocephalic and atraumatic.  Mouth/Throat: Uvula is midline, oropharynx is clear and moist and mucous membranes are normal. No oropharyngeal exudate, posterior oropharyngeal edema, posterior oropharyngeal erythema or tonsillar abscesses. No tonsillar exudate.  Eyes: Conjunctivae and EOM are normal. Right eye exhibits no discharge. Left eye exhibits no discharge. No scleral icterus.  Neck: Normal range of  motion. Neck supple.  Cardiovascular: Normal rate, regular rhythm, normal heart sounds and intact distal pulses.   Pulmonary/Chest: Effort normal and breath sounds normal. No respiratory distress. He has no wheezes. He has no rales. He exhibits tenderness (TTP over sternum and left anterior chest wall.). He exhibits no laceration, no crepitus, no edema, no deformity, no swelling and no retraction.  Abdominal: Soft. Bowel sounds are normal. He exhibits no distension and no mass. There is no tenderness. There is no rebound and no guarding. No hernia.  Musculoskeletal: Normal range of motion. He exhibits no edema.  Neurological: He is alert and oriented to person, place, and time.  Skin: Skin is warm and dry.  Nursing note and vitals reviewed.    ED Treatments / Results  DIAGNOSTIC STUDIES: Oxygen Saturation is 100% on RA, normal by my interpretation.    COORDINATION OF CARE: 4:59 PM- Pt advised of plan for treatment and pt agrees.  Labs (all labs ordered are listed, but only abnormal results are displayed) Labs Reviewed  COMPREHENSIVE METABOLIC PANEL - Abnormal; Notable for the following:       Result Value   Total Bilirubin 0.2 (*)    All other components within normal limits  LIPASE, BLOOD - Abnormal; Notable for the following:    Lipase 53 (*)    All other components within normal limits  CBC WITH DIFFERENTIAL/PLATELET  TROPONIN I    EKG  EKG Interpretation None       Radiology Dg Ribs Unilateral W/chest Left  Result Date: 02/01/2016 CLINICAL DATA:  30 year old male with a history of left anterior rib pain for 2 weeks EXAM: LEFT RIBS AND CHEST - 3+ VIEW COMPARISON:  01/25/2016 FINDINGS: No fracture or other bone lesions are seen involving the ribs. There is no evidence of pneumothorax or pleural effusion. Both lungs are clear. Heart size and mediastinal contours are within normal limits. IMPRESSION: Negative for acute cardiopulmonary disease. No displaced rib fracture.  Signed, Yvone NeuJaime S. Loreta AveWagner, DO [facility] Electronically Signed   By: Gilmer MorJaime  Wagner D.O.   On: 02/01/2016 18:09    Procedures Procedures (including critical care time)  Medications Ordered in ED Medications  ketorolac (TORADOL) 30 MG/ML injection 15 mg (15 mg Intravenous Given 02/01/16 1500)  oxyCODONE-acetaminophen (PERCOCET/ROXICET) 5-325 MG per tablet 1 tablet (1 tablet Oral Given 02/01/16 1858)     Initial Impression / Assessment and Plan / ED Course  I have reviewed the triage vital signs and the nursing notes.  Pertinent labs & imaging results that were available during my care of the patient were reviewed by me and considered in my medical decision making (see chart for details).  Clinical Course    Patient presents with chest pain has been present for the past 2 weeks. Reports being seen in the ED on 10/28 for similar symptoms with associated abdominal pain and states he was diagnosed with pancreatitis. Patient reports taking his medications as prescribed with improvement of abdominal pain and nausea/vomiting. Reports having continued chest pain. Reports falling wall crowd surfing a few weeks ago at a concert. Denies fever.  VSS. Exam revealed tenderness over left anterior chest wall, remaining exam unremarkable. Patient given pain meds in the ED. EKG shows sinus rhythm with no acute ischemic changes. Labs unremarkable. Lab work shows improvement of lipase and LFTs compared to his prior ED visit for pancreatitis. Troponin negative. Chest x-ray negative. Reevaluation patient reports his chest pain has improved. Suspect patient's chest pain is likely due to musculoskeletal chest wall pain. I have a low suspicion for ACS, PE, dissection, or other acute cardiac event at this time. Plan to discharge patient home with symptomatic treatment and PCP follow-up. Discussed return precautions.  Final Clinical Impressions(s) / ED Diagnoses    Final diagnoses:  Chest wall pain    New Prescriptions Discharge Medication List as of 02/01/2016  7:39 PM    START taking these medications   Details  naproxen (NAPROSYN) 500 MG tablet Take 1 tablet (500 mg total) by mouth 2 (two) times daily., Starting Sat 02/01/2016, Print       I personally performed the services described in this documentation, which was scribed in my presence. The recorded information has been reviewed and is accurate.     Satira Sarkicole Elizabeth MontgomeryNadeau, [place] JerseyPA-C 02/02/16 [id number]    Alvira MondayErin Schlossman, MD 02/02/16 1320

## 2017-03-03 IMAGING — CT CT ABD-PELV W/ CM
2 of 4 series · 16 of 46 positions shown, 18 images · IV contrast (APPLIED)
Comparison: 08/22/2008

CLINICAL DATA: Left upper quadrant pain with nausea vomiting.

EXAM:
CT ABDOMEN AND PELVIS WITH CONTRAST
TECHNIQUE: Multidetector CT imaging of the abdomen and pelvis was performed
using the standard protocol following bolus administration of
intravenous contrast.
CONTRAST:  100mL B4IN23-RFF IOPAMIDOL (B4IN23-RFF) INJECTION 61%

[Series 2: axial st · axial · 0.75mm/px · z∈[-462,-32]mm · 13 of 94 slices shown, 15 images]
[im 4/94  soft-tissue]
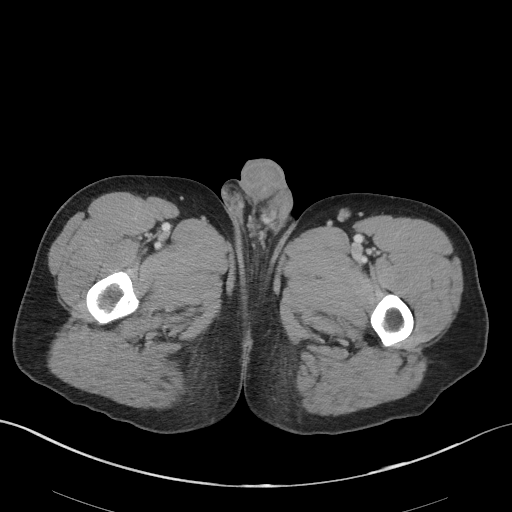
[im 4/94  bone]
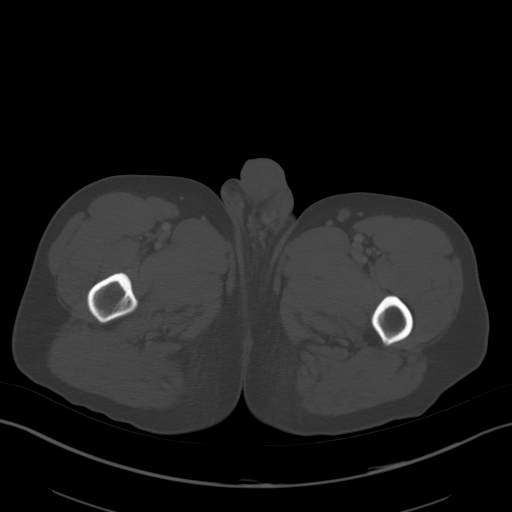
[im 12/94  soft-tissue]
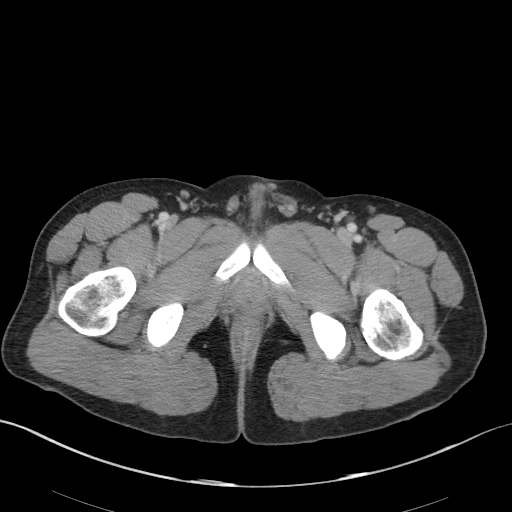
[im 20/94  soft-tissue]
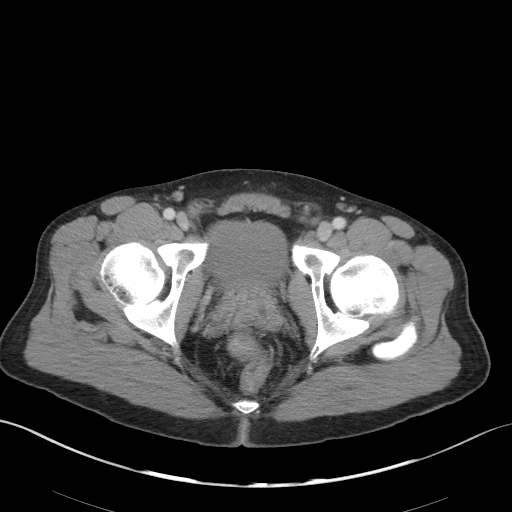
[im 28/94  soft-tissue]
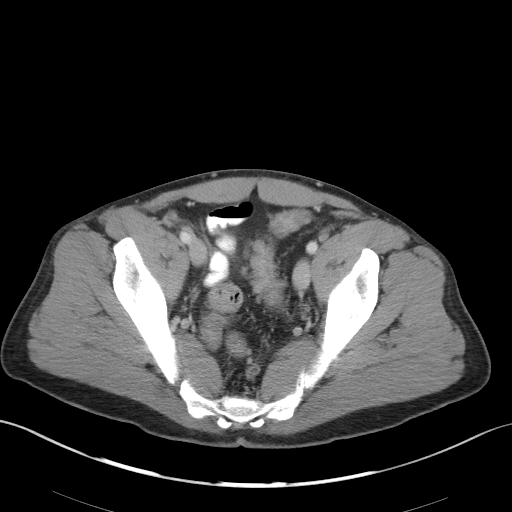
[im 32/94  soft-tissue]
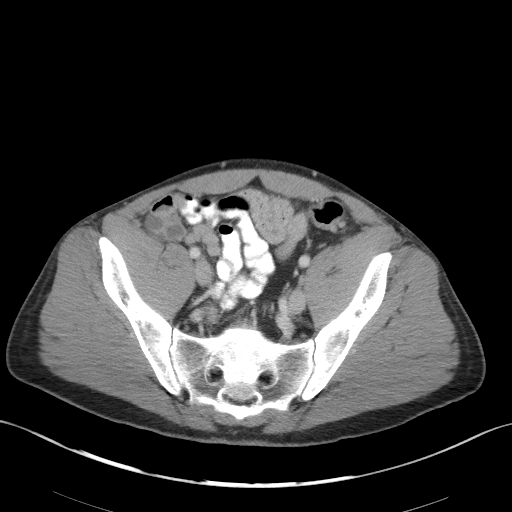
[im 39/94  soft-tissue]
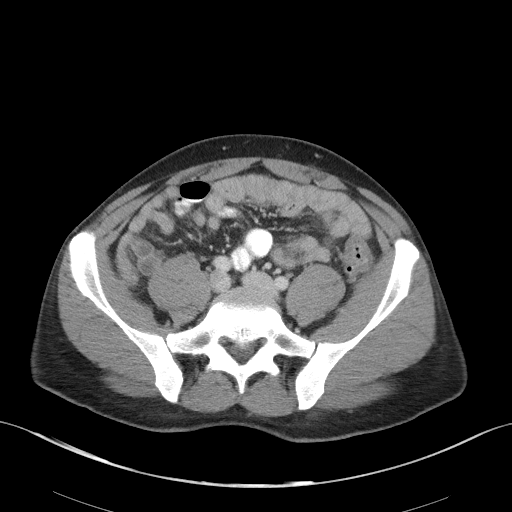
[im 47/94  soft-tissue]
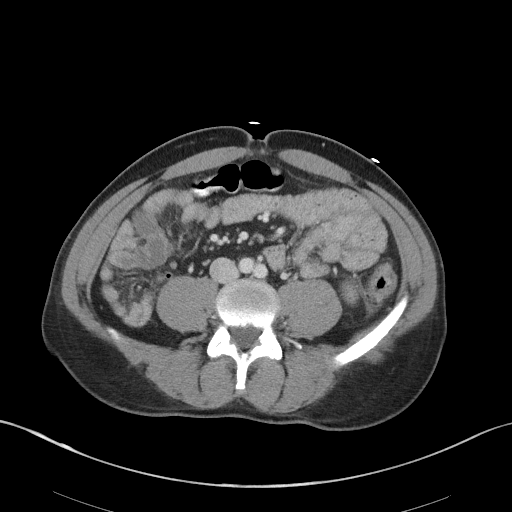
[im 55/94  soft-tissue]
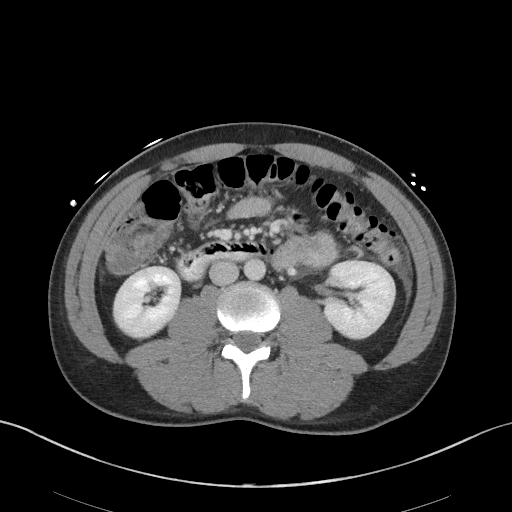
[im 63/94  soft-tissue]
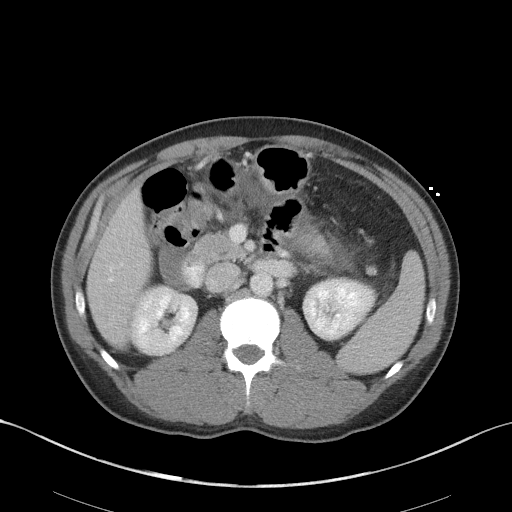
[im 63/94  bone]
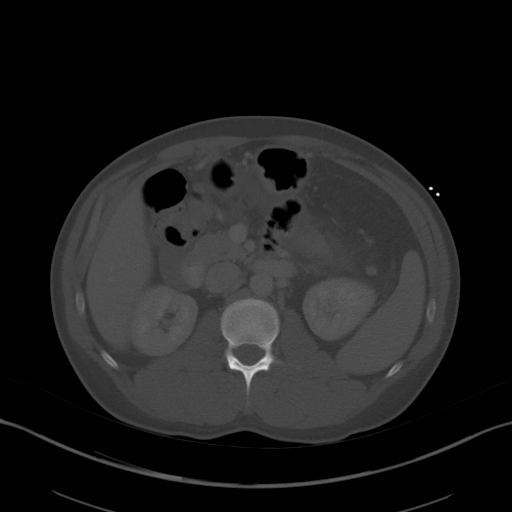
[im 66/94  soft-tissue]
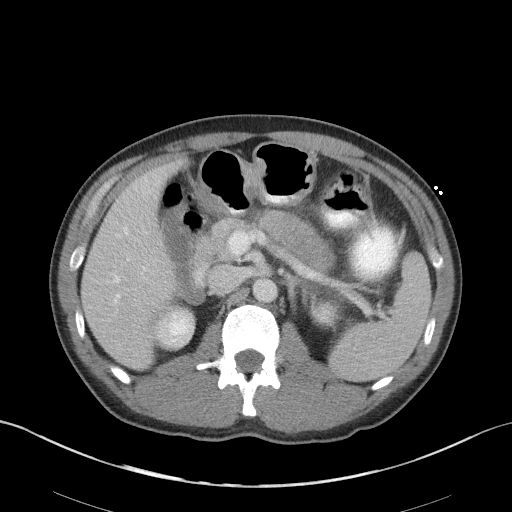
[im 74/94  soft-tissue]
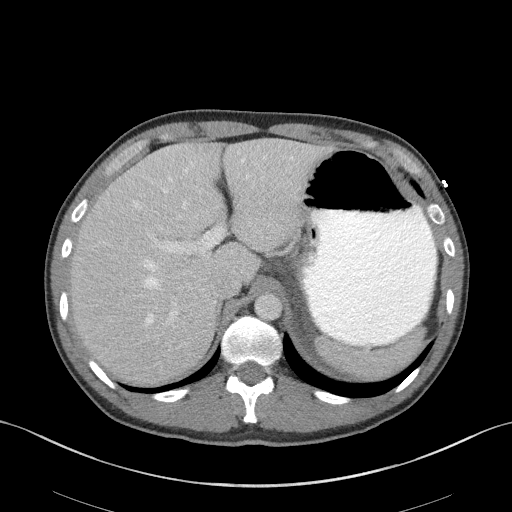
[im 82/94  soft-tissue]
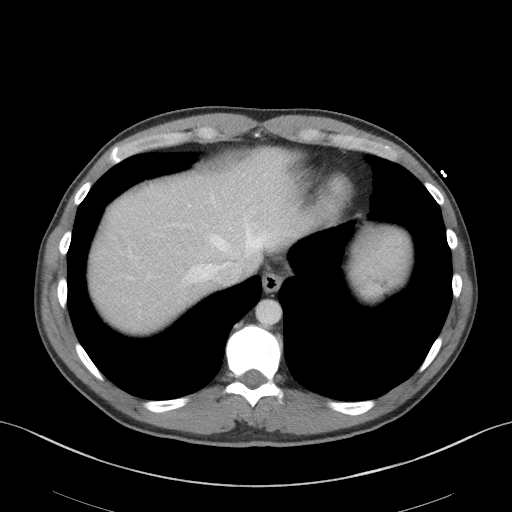
[im 90/94  soft-tissue]
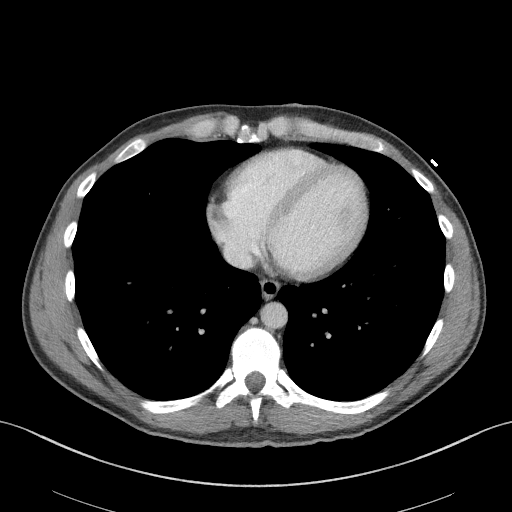

[Series 5: coronal st · coronal · 0.78mm/px · 3 of 81 slices shown]
[im 27/81  soft-tissue]
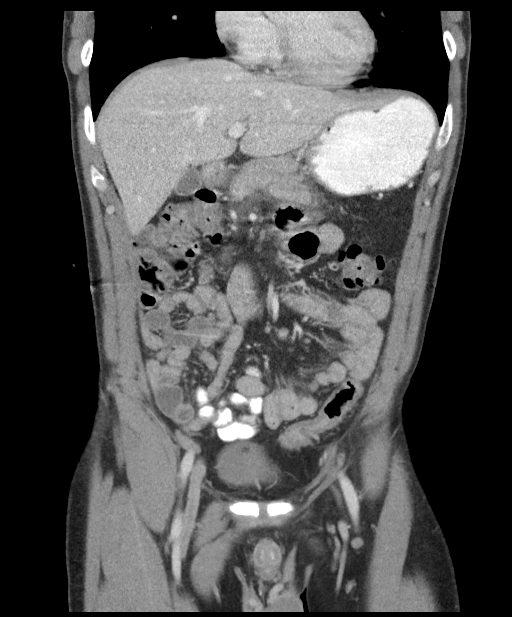
[im 36/81  soft-tissue]
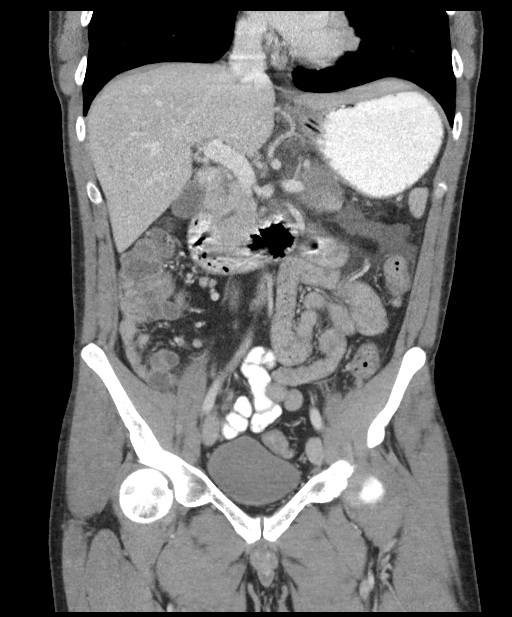
[im 45/81  soft-tissue]
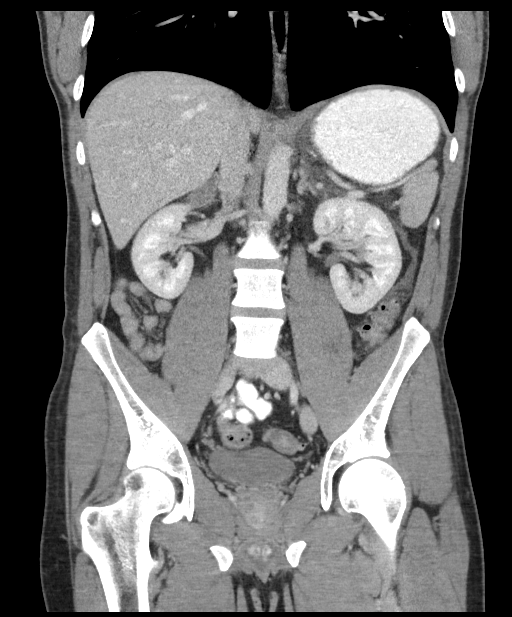

[16 of 46 positions shown; findings below may reference images not displayed]

FINDINGS: Lower chest: No acute abnormality.

Hepatobiliary: No focal liver abnormality is seen. No gallstones,
gallbladder wall thickening, or biliary dilatation.

Pancreas: Mild hypoenhancement of the mid pancreatic body with
expansion and peripancreatic inflammatory changes most consistent
with acute pancreatitis. Peripancreatic fluid is present.

Spleen: Normal in size without focal abnormality.

Adrenals/Urinary Tract: Adrenal glands are unremarkable. Kidneys are
normal, without renal calculi, focal lesion, or hydronephrosis.
Bladder is unremarkable.

Stomach/Bowel: Stomach is within normal limits. Appendix appears
normal. No evidence of bowel wall thickening, distention, or
inflammatory changes. Diverticulosis without evidence of
diverticulitis.

Vascular/Lymphatic: No significant vascular findings are present. No
enlarged abdominal or pelvic lymph nodes.

Reproductive: Prostate is unremarkable.

Other: No abdominal wall hernia or abnormality.

Musculoskeletal: No acute or significant osseous findings.
IMPRESSION: 1. Findings consistent with acute pancreatitis. No focal fluid
collection to suggest a pseudocyst or abscess.

## 2017-08-03 ENCOUNTER — Emergency Department (HOSPITAL_COMMUNITY): Admission: EM | Admit: 2017-08-03 | Discharge: 2017-08-03 | Discharge status: Against Medical Advice | Payer: Self-pay

## 2017-08-03 NOTE — ED Triage Notes (Signed)
Pt walked out of triage heading towards the main hospital
